# Patient Record
Sex: Female | Born: 1950 | Race: White | Hispanic: No | Marital: Married | Smoking: Former smoker
Health system: Southern US, Community
[De-identification: ages and names within clinical notes are randomized; demographics above are authoritative.]

## PROBLEM LIST (undated history)

## (undated) DIAGNOSIS — R609 Edema, unspecified: Secondary | ICD-10-CM

## (undated) DIAGNOSIS — I456 Pre-excitation syndrome: Secondary | ICD-10-CM

## (undated) DIAGNOSIS — K2970 Gastritis, unspecified, without bleeding: Secondary | ICD-10-CM

## (undated) DIAGNOSIS — G43909 Migraine, unspecified, not intractable, without status migrainosus: Secondary | ICD-10-CM

## (undated) DIAGNOSIS — C801 Malignant (primary) neoplasm, unspecified: Secondary | ICD-10-CM

## (undated) HISTORY — PX: TOTAL ABDOMINAL HYSTERECTOMY W/ BILATERAL SALPINGOOPHORECTOMY: SHX83

## (undated) HISTORY — PX: ABDOMINAL HYSTERECTOMY: SHX81

## (undated) HISTORY — PX: BREAST SURGERY: SHX581

---

## 2008-09-18 ENCOUNTER — Ambulatory Visit: Payer: Self-pay | Admitting: Family Medicine

## 2012-08-08 ENCOUNTER — Ambulatory Visit: Payer: Self-pay

## 2015-09-10 ENCOUNTER — Ambulatory Visit
Admission: EM | Admit: 2015-09-10 | Discharge: 2015-09-10 | Disposition: A | Discharge status: Home / Self Care | Payer: BC Managed Care – PPO | Attending: Family Medicine | Admitting: Family Medicine

## 2015-09-10 ENCOUNTER — Encounter: Payer: Self-pay | Admitting: Gynecology

## 2015-09-10 DIAGNOSIS — H109 Unspecified conjunctivitis: Secondary | ICD-10-CM | POA: Diagnosis not present

## 2015-09-10 HISTORY — DX: Migraine, unspecified, not intractable, without status migrainosus: G43.909

## 2015-09-10 HISTORY — DX: Edema, unspecified: R60.9

## 2015-09-10 HISTORY — DX: Gastritis, unspecified, without bleeding: K29.70

## 2015-09-10 HISTORY — DX: Pre-excitation syndrome: I45.6

## 2015-09-10 HISTORY — DX: Malignant (primary) neoplasm, unspecified: C80.1

## 2015-09-10 MED ORDER — MOXIFLOXACIN HCL 0.5 % OP SOLN: 1.0000 [drp] | Freq: Three times a day (TID) | OPHTHALMIC | Status: AC

## 2015-09-10 NOTE — ED Provider Notes (Signed)
CSN: [id number]     Arrival date & time 09/10/15  1139 History   First MD Initiated Contact with Patient 09/10/15 1223     Chief Complaint  Patient presents with  . Conjunctivitis   (Consider location/radiation/quality/duration/timing/severity/associated sxs/prior Treatment) HPI Comments: 64 yo female with a 2 days h/o redness and drainage to both eyes. Patient states last week and a half had a viral URI ("cold") but now resolving. Denies any eye trauma, injury, fevers, chills, or pain.   Patient is a 64 y.o. female presenting with conjunctivitis. The history is provided by the patient.  Conjunctivitis    Past Medical History  Diagnosis Date  . Lown Ganong Levine syndrome   . Migraine   . Edema   . Gastritis   . Cancer [facility])     right   Past Surgical History  Procedure Laterality Date  . Breast surgery      rt. breast lumpectomy  . Total abdominal hysterectomy w/ bilateral salpingoophorectomy     No family history on file. Social History  Substance Use Topics  . Smoking status: Current Every Day Smoker -- 4.00 packs/day    Types: Cigarettes  . Smokeless tobacco: None  . Alcohol Use: Yes   OB History    No data available     Review of Systems  Allergies  Review of patient's allergies indicates no known allergies.  Home Medications   Prior to Admission medications   Medication Sig Start Date End Date Taking? Authorizing Provider  aspirin 81 MG tablet Take 81 mg by mouth daily.   Yes Historical Provider, MD  atenolol (TENORMIN) 50 MG tablet Take 50 mg by mouth daily.   Yes Historical Provider, MD  buPROPion (WELLBUTRIN XL) 300 MG 24 hr tablet Take 300 mg by mouth daily.   Yes Historical Provider, MD  cetirizine (ZYRTEC) 5 MG tablet Take 5 mg by mouth daily.   Yes Historical Provider, MD  furosemide (LASIX) 20 MG tablet Take 20 mg by mouth.   Yes Historical Provider, MD  omeprazole (PRILOSEC) 20 MG capsule Take 20 mg by mouth 2 (two) times daily before a meal.    Yes Historical Provider, MD  sertraline (ZOLOFT) 100 MG tablet Take 100 mg by mouth daily.   Yes Historical Provider, MD  vitamin B-12 (CYANOCOBALAMIN) 250 MCG tablet Take 250 mcg by mouth daily.   Yes Historical Provider, MD  moxifloxacin (VIGAMOX) 0.5 % ophthalmic solution Place 1 drop into both eyes 3 (three) times daily. For 5 days 09/10/15   Norval Gable, MD   Meds Ordered and Administered this Visit  Medications - No data to display  BP 109/70 mmHg  Pulse 77  Temp(Src) 98.3 F (36.8 C) (Oral)  Resp 18  Ht 5\' 4"  (1.626 m)  Wt 210 lb (95.255 kg)  BMI 36.03 kg/m2  SpO2 98% No data found.   Physical Exam  Constitutional: She appears well-developed and well-nourished. No distress.  Eyes: EOM are normal. Pupils are equal, round, and reactive to light. Right eye exhibits no discharge. Left eye exhibits no discharge. Right conjunctiva is injected. Left conjunctiva is injected.  Skin: She is not diaphoretic.  Vitals reviewed.   ED Course  Procedures (including critical care time)  Labs Review Labs Reviewed - No data to display  Imaging Review No results found.   Visual Acuity Review  Right Eye Distance:   Left Eye Distance:   Bilateral Distance:    Right Eye Near: R Near: 20/20 (with corrective lens)  Left Eye Near:  L Near: 20/20 (with corrective lens) Bilateral Near:  20/20 (with corrective lens)      MDM   1. Bilateral conjunctivitis   (likely viral)  1.  diagnosis reviewed with patient/parent/guardian/family 2. rx as per orders above; reviewed possible side effects, interactions, risks and benefits; printed rx for opthalmic antibiotic drops for patient to hold on to in case symptoms change, patient can fill out 3. Recommend supportive treatment with cool compresses and preservative free lubricant drops 4. Follow prn if symptoms worsen or don't improve    Norval Gable, MD 09/10/15 1246

## 2015-09-10 NOTE — ED Notes (Signed)
Patient c/o woke up yesterday with crust on bilateral eyelid. Pt. Stated eyes with drainage / itching/ and mucous.

## 2019-03-06 ENCOUNTER — Emergency Department
Admission: EM | Admit: 2019-03-06 | Discharge: 2019-03-06 | Disposition: A | Discharge status: Home / Self Care | Payer: BC Managed Care – PPO | Attending: Emergency Medicine | Admitting: Emergency Medicine

## 2019-03-06 ENCOUNTER — Ambulatory Visit
Admission: EM | Admit: 2019-03-06 | Discharge: 2019-03-06 | Disposition: A | Discharge status: Nursing Home (Includes ICF) | Payer: BC Managed Care – PPO | Source: Home / Self Care | Attending: Family Medicine | Admitting: Family Medicine

## 2019-03-06 ENCOUNTER — Emergency Department: Payer: BC Managed Care – PPO

## 2019-03-06 ENCOUNTER — Other Ambulatory Visit: Payer: Self-pay

## 2019-03-06 ENCOUNTER — Encounter: Payer: Self-pay | Admitting: Emergency Medicine

## 2019-03-06 DIAGNOSIS — Z8679 Personal history of other diseases of the circulatory system: Secondary | ICD-10-CM | POA: Diagnosis not present

## 2019-03-06 DIAGNOSIS — R0789 Other chest pain: Secondary | ICD-10-CM | POA: Diagnosis not present

## 2019-03-06 DIAGNOSIS — Z7982 Long term (current) use of aspirin: Secondary | ICD-10-CM | POA: Diagnosis not present

## 2019-03-06 DIAGNOSIS — Z87891 Personal history of nicotine dependence: Secondary | ICD-10-CM | POA: Insufficient documentation

## 2019-03-06 DIAGNOSIS — I456 Pre-excitation syndrome: Secondary | ICD-10-CM

## 2019-03-06 DIAGNOSIS — Z79899 Other long term (current) drug therapy: Secondary | ICD-10-CM | POA: Diagnosis not present

## 2019-03-06 DIAGNOSIS — R079 Chest pain, unspecified: Secondary | ICD-10-CM

## 2019-03-06 LAB — CBC
HCT: 41.8 % (ref 36.0–46.0)
Hemoglobin: 14.2 g/dL (ref 12.0–15.0)
MCH: 31.9 pg (ref 26.0–34.0)
MCHC: 34 g/dL (ref 30.0–36.0)
MCV: 93.9 fL (ref 80.0–100.0)
Platelets: 168 10*3/uL (ref 150–400)
RBC: 4.45 MIL/uL (ref 3.87–5.11)
RDW: 12.3 % (ref 11.5–15.5)
WBC: 6.4 10*3/uL (ref 4.0–10.5)
nRBC: 0 % (ref 0.0–0.2)

## 2019-03-06 LAB — COMPREHENSIVE METABOLIC PANEL
ALT: 20 U/L (ref 0–44)
AST: 27 U/L (ref 15–41)
Albumin: 4 g/dL (ref 3.5–5.0)
Alkaline Phosphatase: 44 U/L (ref 38–126)
Anion gap: 14 (ref 5–15)
BUN: 25 mg/dL — ABNORMAL HIGH (ref 8–23)
CO2: 24 mmol/L (ref 22–32)
Calcium: 9.2 mg/dL (ref 8.9–10.3)
Chloride: 104 mmol/L (ref 98–111)
Creatinine, Ser: 0.98 mg/dL (ref 0.44–1.00)
GFR calc Af Amer: >60 mL/min (ref >60)
GFR calc non Af Amer: >60 mL/min (ref >60)
Glucose, Bld: 100 mg/dL — ABNORMAL HIGH (ref 70–99)
Potassium: 4.1 mmol/L (ref 3.5–5.1)
Sodium: 142 mmol/L (ref 135–145)
Total Bilirubin: 0.8 mg/dL (ref 0.3–1.2)
Total Protein: 6.9 g/dL (ref 6.5–8.1)

## 2019-03-06 LAB — PROTIME-INR
INR: 1 (ref 0.8–1.2)
Prothrombin Time: 12.9 seconds (ref 11.4–15.2)

## 2019-03-06 LAB — TROPONIN I
Troponin I: <0.03 ng/mL (ref <0.03)
Troponin I: <0.03 ng/mL (ref <0.03)

## 2019-03-06 LAB — APTT: aPTT: 25 seconds (ref 24–36)

## 2019-03-06 MED ORDER — ASPIRIN 81 MG PO CHEW
324.0000 mg | CHEWABLE_TABLET | Freq: Once | ORAL | Status: AC
  Administered 2019-03-06: 324 mg via ORAL

## 2019-03-06 MED ORDER — ACETAMINOPHEN 500 MG PO TABS
1000.0000 mg | ORAL_TABLET | ORAL | Status: AC
  Administered 2019-03-06: 1000 mg via ORAL
  Filled 2019-03-06: qty 2

## 2019-03-06 MED ORDER — NITROGLYCERIN 2 % TD OINT
0.5000 [in_us] | TOPICAL_OINTMENT | TRANSDERMAL | Status: AC
Start: 2019-03-06 — End: 2019-03-06
  Administered 2019-03-06: 0.5 [in_us] via TOPICAL
  Filled 2019-03-06: qty 1

## 2019-03-06 NOTE — ED Provider Notes (Signed)
[facility] Emergency Department Provider Note   ____________________________________________   First MD Initiated Contact with Patient 03/06/19 1549     (approximate)  I have reviewed the triage vital signs and the nursing notes.   HISTORY  Chief Complaint Chest Pain    HPI A 68 year old patient presents for evaluation of chest pain. Initial onset of pain was more than 6 hours ago. The patient's chest pain is described as heaviness/pressure/tightness and is not worse with exertion. The patient's chest pain is middle- or left-sided, is not well-localized, is not sharp and does radiate to the arms/jaw/neck. The patient does not complain of nausea and denies diaphoresis. The patient has smoked in the past 90 days and has a family history of coronary artery disease in a first-degree relative with onset less than age 59. The patient has no history of stroke, has no history of peripheral artery disease, denies any history of treated diabetes, is not hypertensive, has no history of hypercholesterolemia.  Past Medical History:  Diagnosis Date  . Cancer ([place])    right  . Edema   . Gastritis   . Lown Ganong Levine syndrome   . Migraine     There are no active problems to display for this patient.   Past Surgical History:  Procedure Laterality Date  . ABDOMINAL HYSTERECTOMY    . BREAST SURGERY     rt. breast lumpectomy  . TOTAL ABDOMINAL HYSTERECTOMY W/ BILATERAL SALPINGOOPHORECTOMY      Prior to Admission medications   Medication Sig Start Date End Date Taking? Authorizing Provider  aspirin 81 MG tablet Take 81 mg by mouth daily.    [provider]  atenolol (TENORMIN) 50 MG tablet Take 50 mg by mouth daily.    [provider]  buPROPion (WELLBUTRIN XL) 300 MG 24 hr tablet Take 300 mg by mouth daily.    [provider]  cetirizine (ZYRTEC) 5 MG tablet Take 5 mg by mouth daily.    [provider]  furosemide (LASIX)  20 MG tablet Take 20 mg by mouth.    [provider]  gabapentin (NEURONTIN) 300 MG capsule Take by mouth. 12/14/18 12/15/19  [provider]  L-Lysine HCl 500 MG TABS Take by mouth. 03/05/12   [provider]  moxifloxacin (VIGAMOX) 0.5 % ophthalmic solution Place 1 drop into both eyes 3 (three) times daily. For 5 days 09/10/15   Norval Gable, MD  omeprazole (PRILOSEC) 20 MG capsule Take 20 mg by mouth 2 (two) times daily before a meal.    [provider]  sertraline (ZOLOFT) 100 MG tablet Take 100 mg by mouth daily.    [provider]  vitamin B-12 (CYANOCOBALAMIN) 250 MCG tablet Take 250 mcg by mouth daily.    [provider]    Allergies Patient has no known allergies.  Family History  Problem Relation Age of Onset  . Diabetes Mother   . Cancer Mother   . Cancer Father     Social History Social History   Tobacco Use  . Smoking status: Former Smoker    Packs/day: 4.00    Types: Cigarettes  . Smokeless tobacco: Never Used  Substance Use Topics  . Alcohol use: Yes  . Drug use: No    Review of Systems Constitutional: No fever/chills Eyes: No visual changes. ENT: No sore throat. Cardiovascular: Chest pressure, radiates slightly to her neck and has been fairly consistent since yesterday afternoon.   Respiratory: Denies shortness of breath.  Gastrointestinal: No abdominal pain.   Genitourinary: Negative for dysuria. Musculoskeletal: Negative for back pain. Skin: Negative for rash. Neurological: Negative for headaches, areas of focal weakness or numbness.    ____________________________________________   PHYSICAL EXAM:  VITAL SIGNS: ED Triage Vitals  Enc Vitals Group     BP 03/06/19 1540 115/79     Pulse Rate 03/06/19 1539 (!) 55     Resp 03/06/19 1536 16     Temp 03/06/19 1540 98 F (36.7 C)     Temp Source 03/06/19 1540 Oral     SpO2 03/06/19 1539 97 %     Weight 03/06/19 1535 190 lb (86.2 kg)     Height  03/06/19 1535 5\' 4"  (1.626 m)     Head Circumference --      Peak Flow --      Pain Score 03/06/19 1535 4     Pain Loc --      Pain Edu? --      Excl. in [place]? --     Constitutional: Alert and oriented. Well appearing and in no acute distress. Eyes: Conjunctivae are normal. Head: Atraumatic. Nose: No congestion/rhinnorhea. Mouth/Throat: Mucous membranes are moist. Neck: No stridor.  Cardiovascular: Normal rate, regular rhythm. Grossly normal heart sounds.  Good peripheral circulation. Respiratory: Normal respiratory effort.  No retractions. Lungs CTAB. Gastrointestinal: Soft and nontender. No distention. Musculoskeletal: No lower extremity tenderness nor edema.  No edema of the legs.  No venous cords or congestion. Neurologic:  Normal speech and language. No gross focal neurologic deficits are appreciated.  Skin:  Skin is warm, dry and intact. No rash noted. Psychiatric: Mood and affect are normal. Speech and behavior are normal.  ____________________________________________   LABS (all labs ordered are listed, but only abnormal results are displayed)  Labs Reviewed  COMPREHENSIVE METABOLIC PANEL - Abnormal; Notable for the following components:      Result Value   Glucose, Bld 100 (*)    BUN 25 (*)    All other components within normal limits  CBC  TROPONIN I  PROTIME-INR  APTT  TROPONIN I   ____________________________________________  EKG  Reviewed entered by me at 1540 Heart rate 60 QRS 90 QTc 430 Normal sinus rhythm, no evidence of an obvious acute ischemia.  PR interval is somewhat shortened but this is a known condition ____________________________________________  RADIOLOGY  Dg Chest 2 View  Result Date: 03/06/2019 CLINICAL DATA:  New onset chest pain for 3 days. EXAM: CHEST - 2 VIEW COMPARISON:  None. FINDINGS: The cardiomediastinal silhouette is within normal limits. Aortic atherosclerosis is noted. There is mild anterior eventration of the right  hemidiaphragm. There may be 1 or 2 small calcified granulomas in the right lung base. Minimal scarring is noted in the left lung base. A 15 mm density projects over the right upper lung at the intersection of the anterior second and posterior sixth ribs adjacent to an EKG lead. No acute osseous abnormality is seen. IMPRESSION: 1. No evidence of acute cardiopulmonary process. 2. 15 mm density projecting over the right upper lung adjacent to an EKG lead. This could reflect material external to the patient, a lung nodule, or a rib lesion. Consider obtaining a repeat PA chest radiograph after removing the EKG leads and any other external material. Electronically Signed   By: Logan Bores M.D.   On: 03/06/2019 17:17   Dg Chest Portable 1 View  Result Date: 03/06/2019 CLINICAL DATA:  Follow-up chest x-ray recommended after removing EKG leads. EXAM:  PORTABLE CHEST 1 VIEW COMPARISON:  Chest x-ray from earlier same day. FINDINGS: EKG leads have been removed. Lungs now appear clear. No pulmonary nodule or consolidation appreciated. No pleural effusion or pneumothorax seen. Heart size and mediastinal contours are within normal limits. Osseous structures about the chest are unremarkable. IMPRESSION: No active disease. Lungs are clear. No evidence of pneumonia or pulmonary edema. Electronically Signed   By: Franki Cabot M.D.   On: 03/06/2019 18:33      Imaging reviewed negative for acute and repeat does not show concern for density ____________________________________________   PROCEDURES  Procedure(s) performed: None  Procedures  Critical Care performed: No  ____________________________________________   INITIAL IMPRESSION / ASSESSMENT AND PLAN / ED COURSE  Pertinent labs & imaging results that were available during my care of the patient were reviewed by me and considered in my medical decision making (see chart for details).   Differential diagnosis includes, but is not limited to, ACS, aortic  dissection, pulmonary embolism, cardiac tamponade, pneumothorax, pneumonia, pericarditis, myocarditis, GI-related causes including esophagitis/gastritis, and musculoskeletal chest wall pain.    Patient describes a chest pressure.  She has been given aspirin 324 urgent care.  She relates a fairly steady pressure feeling in the mid upper chest that radiates slightly to her neck.  Her initial EKG is not ischemic.  She does have risk factors namely very strong family history, her age also factors in.  Her symptoms are a mixed picture, but some symptoms are concerning for an ACS-like pain.  Will await further testing, also trial Nitropaste as her blood pressure is normal.  No risk factors or symptoms suggest pulmonary embolism or dissection.  Order chest x-ray.  No infectious symptomatology.  Clinical Course as of Mar 05 2308  Sat Mar 06, 2019  1754 Patient is doing well at this time.  Reports her pain is just very minimal slight feeling of tightness.  I discussed with the patient, recommended admission to the hospital for testing and make certain she does not have worsening symptoms or possible "heart attack".  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  Reviewed her test results.  With current COVID outbreak trying to work not to hospitalize no steps within necessary.  Discussed with the patient and I think it is reasonable to check a second troponin and if this is normal we did send her home with careful return precautions which is her strong desire.  Engaged in shared medical decision making,   [MQ]  1950 Dr. Rockey Situ discussed and reviewed case and EKG with me. Had a myoview in the past (2015). Dr. Rockey Situ advises can be seen outpatient on Monday or Tuesday. Cardiology clinic will call her for web visit (due to [place]), likely Monday or Tuesday. Dr. Rockey Situ will contact scheduling team to get a visit setup Monday or Tuesday.     [MQ]    Clinical Course User Index [MQ] Delman Kitten, MD   Return precautions and treatment recommendations and follow-up discussed with the patient who is agreeable with the plan.  ____________________________________________   FINAL CLINICAL IMPRESSION(S) / ED DIAGNOSES  Final diagnoses:  Chest pain with low risk of acute coronary syndrome        Note:  This document was prepared using Dragon voice recognition software and may include unintentional dictation errors       Delman Kitten, MD 03/06/19 2309

## 2019-03-06 NOTE — ED Triage Notes (Signed)
Patient c/o chest pain for the past 2 days.  Patient denies SOB.  Patient denies N/V.

## 2019-03-06 NOTE — ED Provider Notes (Signed)
[facility]    CSN: [id number] Arrival date & time: 03/06/19  1342     History   Chief Complaint Chief Complaint  Patient presents with  . Chest Pain    HPI Alexis Acosta is a 68 y.o. female.   68 yo female with a c/o substernal chest pressure since yesterday. States yesterday felt palpitations with heart racing which seemed to resolve with Valsalva maneuver. States she thought it was maybe stomach related and took some over the counter medicine but no relief. Pressure is currently 5/10, not associated with position changes or deep breathing.  Denies any cough, injury, fevers, chills, wheezing. 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 Reports a h/o smoking but quit 3 months ago.   FH: positive for CAD (states mom had MI in her 35s)  The history is provided by the patient.    Past Medical History:  Diagnosis Date  . Cancer ([place])    right  . Edema   . Gastritis   . Lown Ganong Levine syndrome   . Migraine     There are no active problems to display for this patient.   Past Surgical History:  Procedure Laterality Date  . ABDOMINAL HYSTERECTOMY    . BREAST SURGERY     rt. breast lumpectomy  . TOTAL ABDOMINAL HYSTERECTOMY W/ BILATERAL SALPINGOOPHORECTOMY      OB History   No obstetric history on file.      Home Medications    Prior to Admission medications   Medication Sig Start Date End Date Taking? Authorizing Provider  atenolol (TENORMIN) 50 MG tablet Take 50 mg by mouth daily.   Yes [provider]  buPROPion (WELLBUTRIN XL) 300 MG 24 hr tablet Take 300 mg by mouth daily.   Yes [provider]  cetirizine (ZYRTEC) 5 MG tablet Take 5 mg by mouth daily.   Yes [provider]  gabapentin (NEURONTIN) 300 MG capsule Take by mouth. 12/14/18 12/15/19 Yes [provider]  L-Lysine HCl 500 MG TABS Take by mouth. 03/05/12  Yes [provider]  sertraline  (ZOLOFT) 100 MG tablet Take 100 mg by mouth daily.   Yes [provider]  vitamin B-12 (CYANOCOBALAMIN) 250 MCG tablet Take 250 mcg by mouth daily.   Yes [provider]  aspirin 81 MG tablet Take 81 mg by mouth daily.    [provider]  furosemide (LASIX) 20 MG tablet Take 20 mg by mouth.    [provider]  moxifloxacin (VIGAMOX) 0.5 % ophthalmic solution Place 1 drop into both eyes 3 (three) times daily. For 5 days 09/10/15   Norval Gable, MD  omeprazole (PRILOSEC) 20 MG capsule Take 20 mg by mouth 2 (two) times daily before a meal.    [provider]    Family History Family History  Problem Relation Age of Onset  . Diabetes Mother   . Cancer Mother   . Cancer Father     Social History Social History   Tobacco Use  . Smoking status: Former Smoker    Packs/day: 4.00    Types: Cigarettes  . Smokeless tobacco: Never Used  Substance Use Topics  . Alcohol use: Yes  . Drug use: No     Allergies   Patient has no known allergies.   Review of Systems Review of Systems   Physical Exam Triage Vital Signs ED Triage Vitals  Enc Vitals Group     BP  03/06/19 1405 103/75     Pulse Rate 03/06/19 1405 72     Resp 03/06/19 1405 16     Temp 03/06/19 1405 97.9 F (36.6 C)     Temp Source 03/06/19 1405 Oral     SpO2 03/06/19 1405 97 %     Weight 03/06/19 1400 190 lb (86.2 kg)     Height 03/06/19 1400 5\' 4"  (1.626 m)     Head Circumference --      Peak Flow --      Pain Score 03/06/19 1400 5     Pain Loc --      Pain Edu? --      Excl. in [place]? --    No data found.  Updated Vital Signs BP 103/75 (BP Location: Right Arm)   Pulse 72   Temp 97.9 F (36.6 C) (Oral)   Resp 16   Ht 5\' 4"  (1.626 m)   Wt 86.2 kg   SpO2 97%   BMI 32.61 kg/m   Visual Acuity Right Eye Distance:   Left Eye Distance:   Bilateral Distance:    Right Eye Near:   Left Eye Near:    Bilateral Near:     Physical Exam Vitals signs and nursing  note reviewed.  Constitutional:      General: She is not in acute distress.    Appearance: She is not toxic-appearing or diaphoretic.  Cardiovascular:     Rate and Rhythm: Normal rate and regular rhythm.  Pulmonary:     Effort: Pulmonary effort is normal. No respiratory distress.     Breath sounds: Normal breath sounds.  Neurological:     Mental Status: She is alert.      UC Treatments / Results  Labs (all labs ordered are listed, but only abnormal results are displayed) Labs Reviewed - No data to display  EKG None  Radiology No results found.  Procedures ED EKG Date/Time: 03/06/2019 4:53 PM Performed by: Norval Gable, MD Authorized by: Norval Gable, MD   ECG reviewed by ED Physician in the absence of a cardiologist: yes   Previous ECG:    Previous ECG:  Unavailable Interpretation:    Interpretation: normal   Rate:    ECG rate assessment: normal   Rhythm:    Rhythm: sinus rhythm   Ectopy:    Ectopy: none   QRS:    QRS axis:  Normal   QRS intervals:  Normal Conduction:    Conduction: normal   ST segments:    ST segments:  Normal T waves:    T waves: normal     (including critical care time)  Medications Ordered in UC Medications  aspirin chewable tablet 324 mg (324 mg Oral Given 03/06/19 1454)    Initial Impression / Assessment and Plan / UC Course  I have reviewed the triage vital signs and the nursing notes.  Pertinent labs & imaging results that were available during my care of the patient were reviewed by me and considered in my medical decision making (see chart for details).      Final Clinical Impressions(s) / UC Diagnoses   Final diagnoses:  Chest pressure  Lown Jerilynn Birkenhead syndrome    ED Prescriptions    None     1. ekg normal, however explained to patient that due to her multiple risk factors, including h/o a tachy-brady syndrome and current chest pressure, recommend patient go to ED for further evaluation and management   Controlled Substance Prescriptions [place] Controlled  Substance Registry consulted? Not Applicable   Norval Gable, MD 03/06/19 1659

## 2019-03-06 NOTE — ED Notes (Signed)
Pt SB on monitor with occasional PVCs.

## 2019-03-06 NOTE — ED Notes (Signed)
EKG completed

## 2019-03-06 NOTE — Discharge Instructions (Addendum)

## 2019-03-06 NOTE — ED Notes (Signed)
Pt given warm blanket. Bed in lowest position. Rail up. Call bell within reach.

## 2019-03-06 NOTE — ED Triage Notes (Signed)
Pt in via [facility] from [facility] urgent care d/t CP. CP started yesterday afternoon. VSS; 22g R hand per EMS; 324 ASA per EMS; History of loungangonglavine; pt is on Atenolol. Pt states she tried OTC meds to help with pain but it hasn't helped. 4/10 pressure in medial chest/back/neck.

## 2019-03-08 ENCOUNTER — Telehealth: Payer: Self-pay

## 2019-03-08 NOTE — Telephone Encounter (Signed)
Tried to contact patient.  No answer and VM was full.  Will try again later.

## 2019-03-08 NOTE — Telephone Encounter (Signed)
-----   Message from Minna Merritts, MD sent at 03/06/2019  7:54 PM EDT ----- Regarding: new Seen in the ER for chest pain on Saturday She is a nurse in ICU by report Needs new patient web visit with me, preferably Monday/Tuesday Thx TG

## 2019-03-09 NOTE — Telephone Encounter (Signed)
Tried to contact patient.  No answer and VM was full.   Tried to call Home Number.  No answer.

## 2019-03-10 ENCOUNTER — Encounter: Payer: Self-pay | Admitting: Emergency Medicine

## 2019-03-11 NOTE — Telephone Encounter (Signed)
Unable to contact mailed letter.

## 2019-03-18 NOTE — Telephone Encounter (Signed)
Left voicemail message to call back regarding upcoming appointment and to review her information.

## 2019-12-30 IMAGING — CR CHEST - 2 VIEW
2 series · 2 of 2 positions shown · non-contrast
Comparison: None.

CLINICAL DATA: New onset chest pain for 3 days.

EXAM:
CHEST - 2 VIEW

[chest pa]
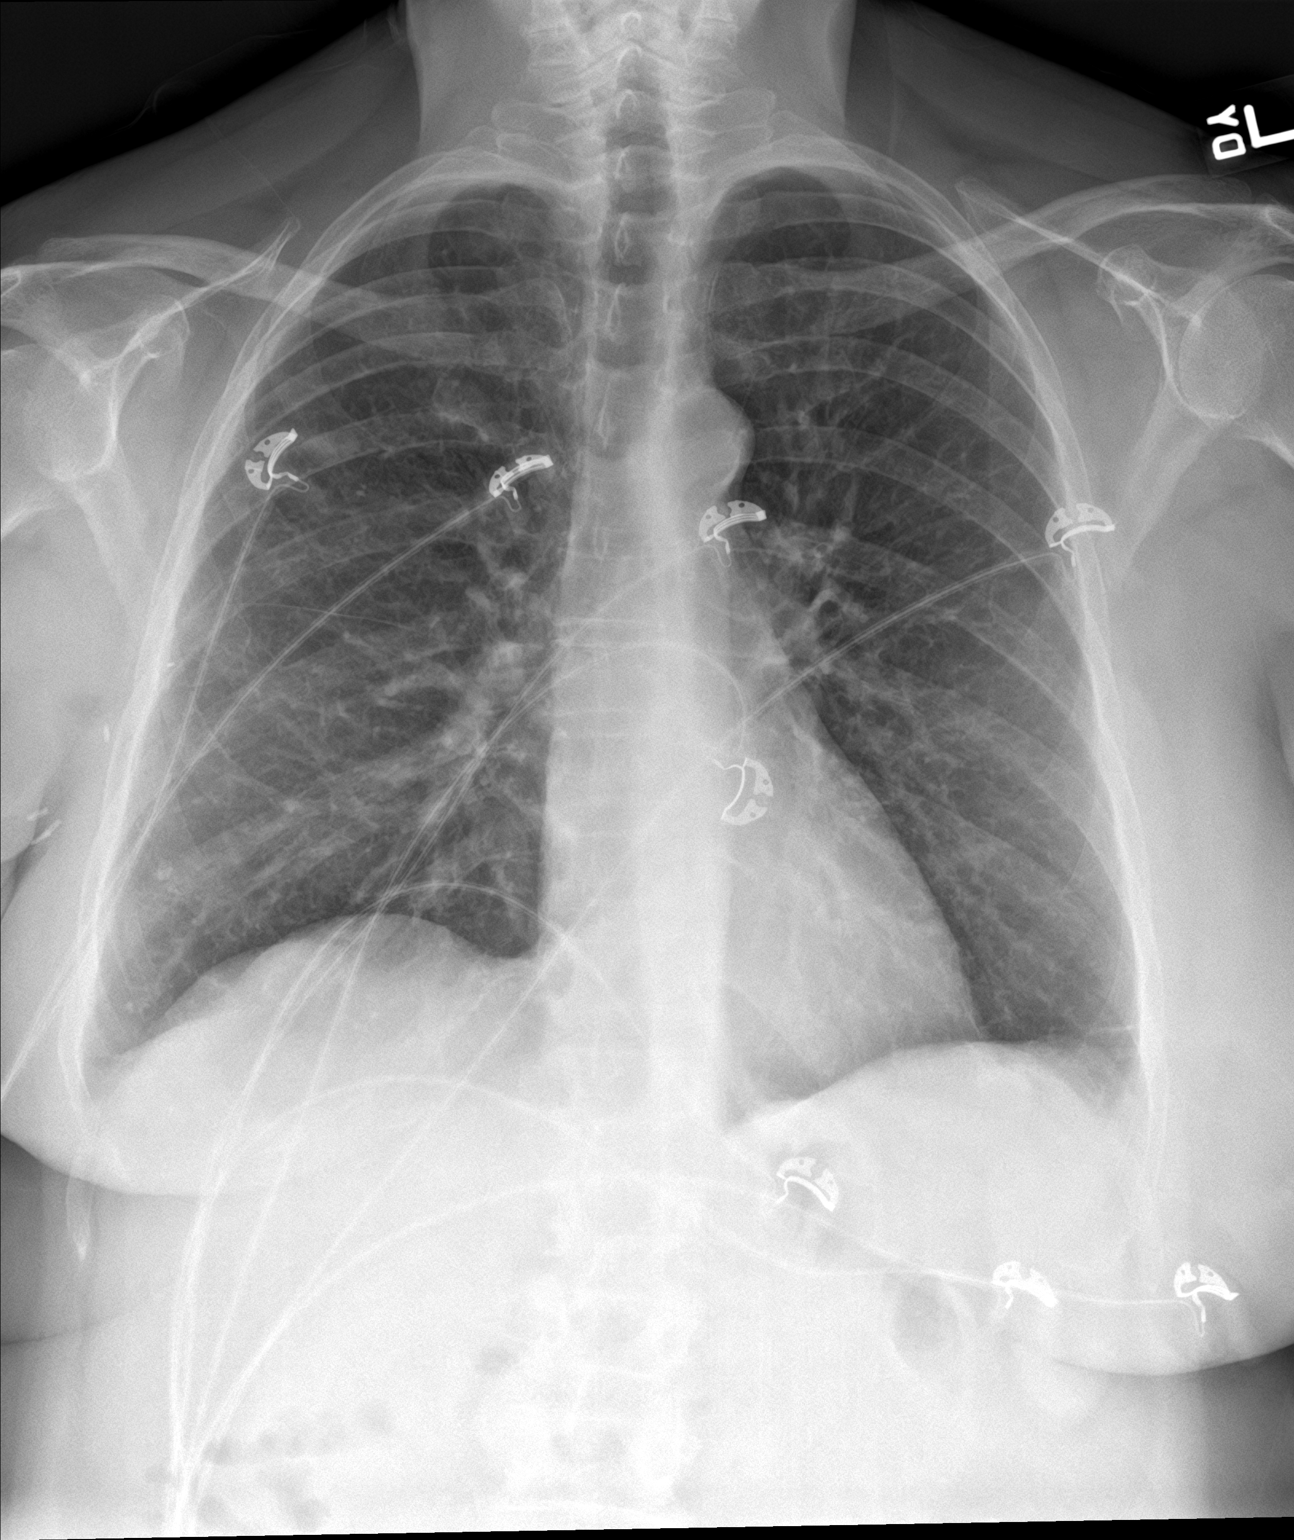

[chest lat]
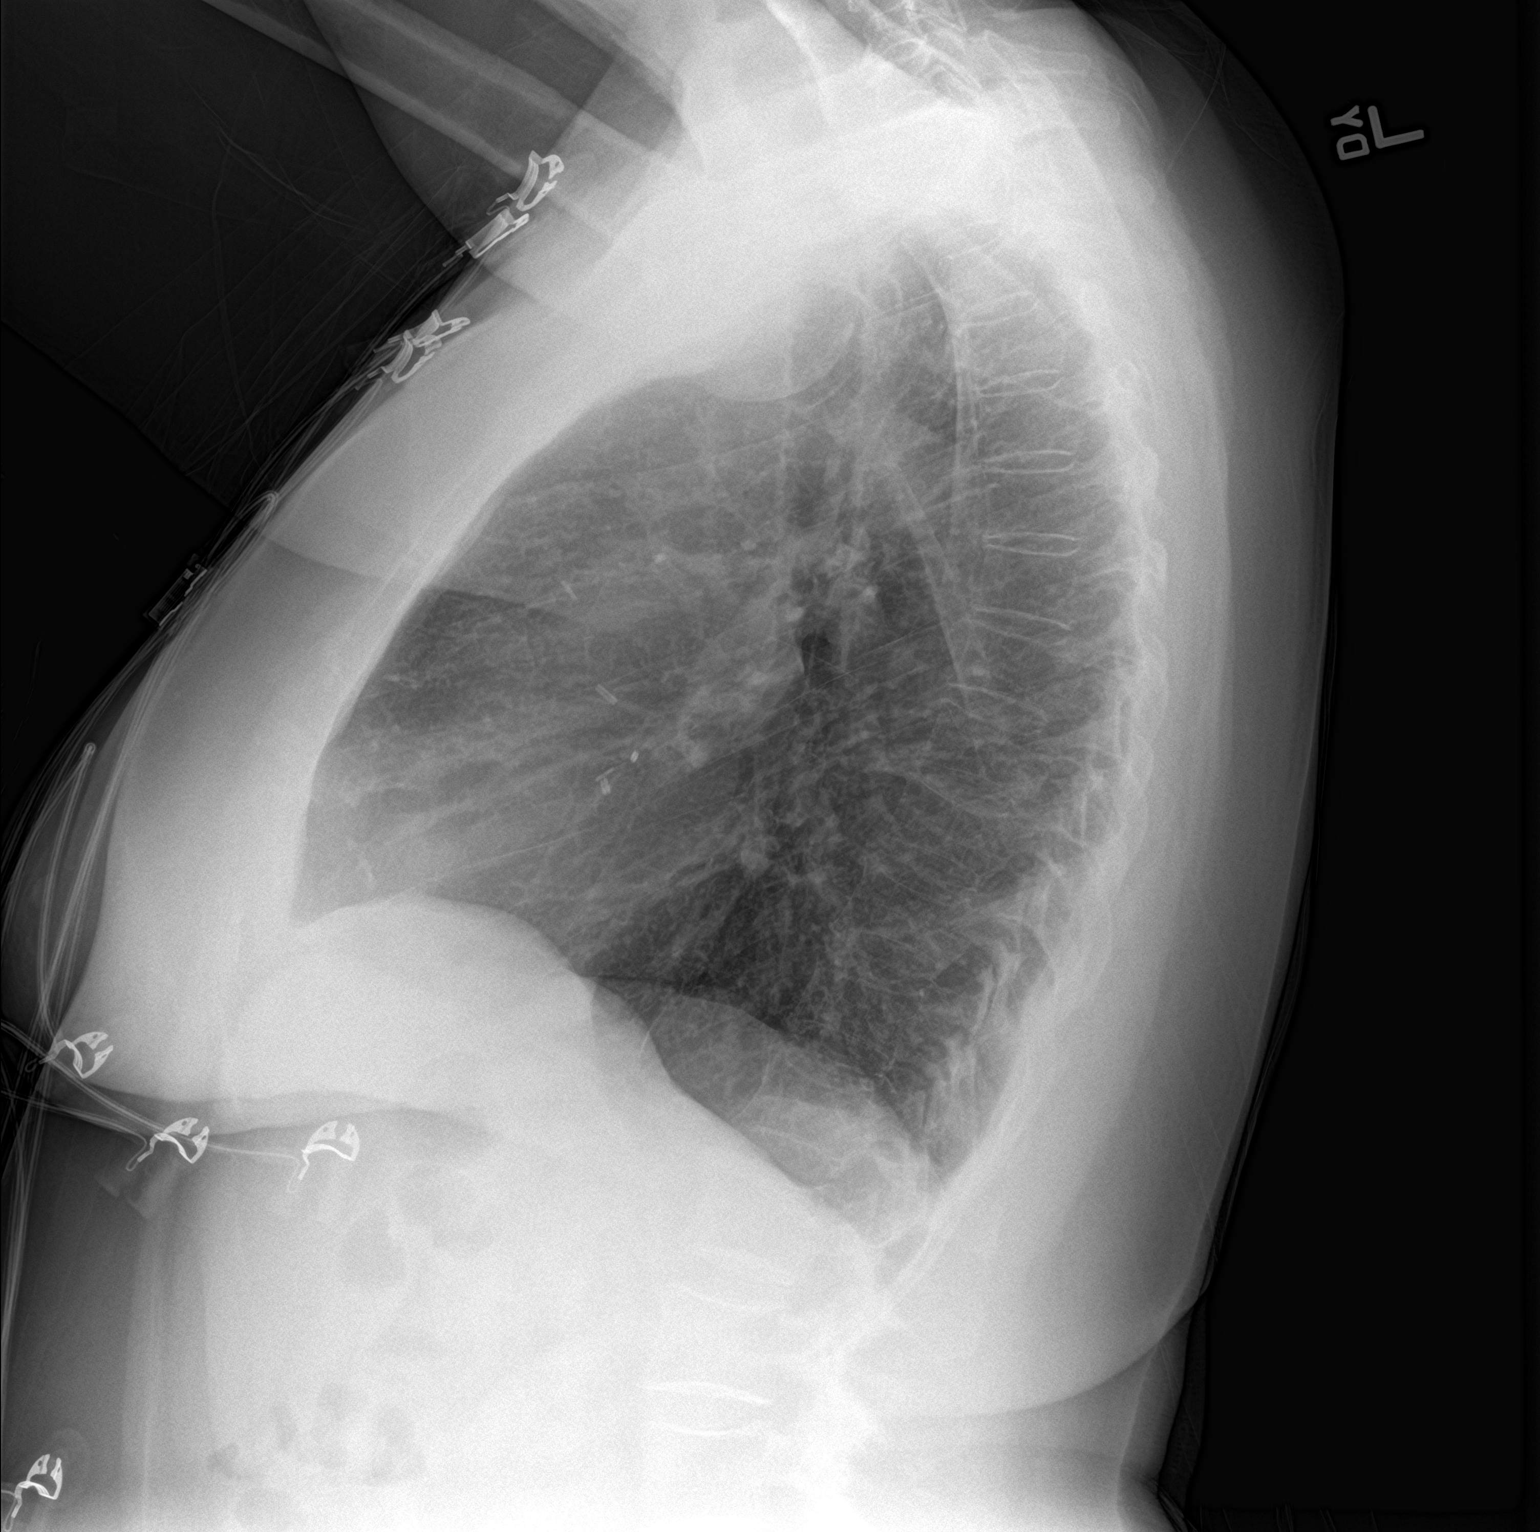

[2 of 2 positions shown; findings below may reference images not displayed]

FINDINGS: The cardiomediastinal silhouette is within normal limits. Aortic
atherosclerosis is noted. There is mild anterior eventration of the
right hemidiaphragm. There [DATE] or 2 small calcified granulomas
in the right lung base. Minimal scarring is noted in the left lung
base. A 15 mm density projects over the right upper lung at the
intersection of the anterior second and posterior sixth ribs
adjacent to an EKG lead. No acute osseous abnormality is seen.
IMPRESSION: 1. No evidence of acute cardiopulmonary process.
2. 15 mm density projecting over the right upper lung adjacent to an
EKG lead. This could reflect material external to the patient, a
lung nodule, or a rib lesion. Consider obtaining a repeat PA chest
radiograph after removing the EKG leads and any other external
material.
